# Patient Record
Sex: Male | Born: 1980 | Race: Black or African American | Hispanic: No | Marital: Single | Smoking: Never smoker
Health system: Southern US, Community
[De-identification: ages and names within clinical notes are randomized; demographics above are authoritative.]

---

## 2013-12-30 ENCOUNTER — Emergency Department (HOSPITAL_COMMUNITY)
Admission: EM | Admit: 2013-12-30 | Discharge: 2013-12-30 | Disposition: A | Discharge status: Home / Self Care | Payer: Self-pay | Attending: Emergency Medicine | Admitting: Emergency Medicine

## 2013-12-30 ENCOUNTER — Encounter (HOSPITAL_COMMUNITY): Payer: Self-pay | Admitting: Emergency Medicine

## 2013-12-30 DIAGNOSIS — R200 Anesthesia of skin: Secondary | ICD-10-CM | POA: Insufficient documentation

## 2013-12-30 DIAGNOSIS — J0100 Acute maxillary sinusitis, unspecified: Secondary | ICD-10-CM | POA: Insufficient documentation

## 2013-12-30 MED ORDER — PSEUDOEPHEDRINE HCL ER 120 MG PO TB12: 120.0000 mg | ORAL_TABLET | Freq: Two times a day (BID) | ORAL | Status: DC

## 2013-12-30 MED ORDER — AMOXICILLIN 500 MG PO CAPS: 1000.0000 mg | ORAL_CAPSULE | Freq: Two times a day (BID) | ORAL | Status: DC

## 2013-12-30 MED ORDER — NAPROXEN 500 MG PO TABS: 500.0000 mg | ORAL_TABLET | Freq: Two times a day (BID) | ORAL | Status: DC

## 2013-12-30 NOTE — Discharge Instructions (Signed)
Sinusitis °Sinusitis is redness, soreness, and puffiness (inflammation) of the air pockets in the bones of your face (sinuses). The redness, soreness, and puffiness can cause air and mucus to get trapped in your sinuses. This can allow germs to grow and cause an infection.  °HOME CARE  °· Drink enough fluids to keep your pee (urine) clear or pale yellow. °· Use a humidifier in your home. °· Run a hot shower to create steam in the bathroom. Sit in the bathroom with the door closed. Breathe in the steam 3-4 times a day. °· Put a warm, moist washcloth on your face 3-4 times a day, or as told by your doctor. °· Use salt water sprays (saline sprays) to wet the thick fluid in your nose. This can help the sinuses drain. °· Only take medicine as told by your doctor. °GET HELP RIGHT AWAY IF:  °· Your pain gets worse. °· You have very bad headaches. °· You are sick to your stomach (nauseous). °· You throw up (vomit). °· You are very sleepy (drowsy) all the time. °· Your face is puffy (swollen). °· Your vision changes. °· You have a stiff neck. °· You have trouble breathing. °MAKE SURE YOU:  °· Understand these instructions. °· Will watch your condition. °· Will get help right away if you are not doing well or get worse. °Document Released: 08/24/2007 Document Revised: 11/30/2011 Document Reviewed: 10/11/2011 °ExitCare® Patient Information ©2015 [employer]. This information is not intended to replace advice given to you by your health care provider. Make sure you discuss any questions you have with your health care provider. ° °

## 2013-12-30 NOTE — ED Provider Notes (Signed)
CSN: [id number]     Arrival date & time 12/30/13  [id number] History   First MD Initiated Contact with Patient 12/30/13 1005     Chief Complaint  Patient presents with  . Chills  . Numbness    HPI Patient presents to the emergency room with complaints of fevers chills nasal congestion and facial pain. The symptoms started a couple days ago. He thought he was developing a cold because of the nasal congestion. His symptoms have progressed and now he feels like he is having pressure, pain and numbness on the right side of his face. Symptoms worsened with palpation. He denies any trouble with neck pain or stiffness. He's not having any issues with numbness or weakness elsewhere. Is not having any slurred speech or trouble with his vision History reviewed. No pertinent past medical history. History reviewed. No pertinent past surgical history. No family history on file. History  Substance Use Topics  . Smoking status: Never Smoker   . Smokeless tobacco: Not on file  . Alcohol Use: No    Review of Systems  All other systems reviewed and are negative.     Allergies  Review of patient's allergies indicates no known allergies.  Home Medications   Prior to Admission medications   Not on File   BP 134/83  Pulse 97  Temp(Src) 98 F (36.7 C) (Oral)  Resp 18  SpO2 98% Physical Exam  Nursing note and vitals reviewed. Constitutional: He appears well-developed and well-nourished. No distress.  HENT:  Head: Normocephalic and atraumatic.  Right Ear: External ear normal.  Left Ear: External ear normal.  Mouth/Throat: No oropharyngeal exudate.  Sinus tenderness in the right maxillary region, no edema or erythema  Eyes: Conjunctivae are normal. Right eye exhibits no discharge. Left eye exhibits no discharge. No scleral icterus.  Neck: Neck supple. No tracheal deviation present.  Cardiovascular: Normal rate, regular rhythm and intact distal pulses.   Pulmonary/Chest: Effort normal and breath  sounds normal. No stridor. No respiratory distress. He has no wheezes. He has no rales.  Abdominal: Soft. Bowel sounds are normal. He exhibits no distension. There is no tenderness. There is no rebound and no guarding.  Musculoskeletal: He exhibits no edema and no tenderness.  Neurological: He is alert. He has normal strength. No cranial nerve deficit (no facial droop, extraocular movements intact, no slurred speech) or sensory deficit. He exhibits normal muscle tone. He displays no seizure activity. Coordination normal.  Skin: Skin is warm and dry. No rash noted.  Psychiatric: He has a normal mood and affect.    ED Course  Procedures (including critical care time)   MDM   Final diagnoses:  Acute maxillary sinusitis, recurrence not specified   Patient's symptoms are consistent with a sinusitis. Possibly viral or early bacterial. With his unilateral symptoms I will prescribe a course of amoxicillin, decongestants and medications for pain    Linwood DibblesJon Kelyse Pask, MD 12/30/13 1022

## 2013-12-30 NOTE — ED Notes (Signed)
Pt c/o right side numbness to face and chills x 2 days, states he thought he had a cold due to his nasal congestion. Denies pain.

## 2014-01-13 ENCOUNTER — Ambulatory Visit (INDEPENDENT_AMBULATORY_CARE_PROVIDER_SITE_OTHER): Payer: Self-pay | Admitting: Family Medicine

## 2014-01-13 VITALS — BP 118/80 | HR 71 | Temp 98.0°F | Resp 18 | Ht 68.0 in | Wt 185.6 lb

## 2014-01-13 DIAGNOSIS — Z0000 Encounter for general adult medical examination without abnormal findings: Secondary | ICD-10-CM

## 2014-01-13 DIAGNOSIS — Z029 Encounter for administrative examinations, unspecified: Secondary | ICD-10-CM
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
## 2014-04-18 ENCOUNTER — Emergency Department (HOSPITAL_COMMUNITY)
Admission: EM | Admit: 2014-04-18 | Discharge: 2014-04-18 | Disposition: A | Discharge status: Home / Self Care | Payer: Self-pay | Attending: Emergency Medicine | Admitting: Emergency Medicine

## 2014-04-18 ENCOUNTER — Emergency Department (HOSPITAL_COMMUNITY): Payer: Self-pay

## 2014-04-18 ENCOUNTER — Other Ambulatory Visit: Payer: Self-pay

## 2014-04-18 ENCOUNTER — Encounter (HOSPITAL_COMMUNITY): Payer: Self-pay

## 2014-04-18 DIAGNOSIS — S61432A Puncture wound without foreign body of left hand, initial encounter: Secondary | ICD-10-CM | POA: Insufficient documentation

## 2014-04-18 DIAGNOSIS — S0191XA Laceration without foreign body of unspecified part of head, initial encounter: Secondary | ICD-10-CM

## 2014-04-18 DIAGNOSIS — Y9289 Other specified places as the place of occurrence of the external cause: Secondary | ICD-10-CM | POA: Insufficient documentation

## 2014-04-18 DIAGNOSIS — S0183XA Puncture wound without foreign body of other part of head, initial encounter: Secondary | ICD-10-CM | POA: Insufficient documentation

## 2014-04-18 DIAGNOSIS — S61412A Laceration without foreign body of left hand, initial encounter: Secondary | ICD-10-CM

## 2014-04-18 DIAGNOSIS — S3992XA Unspecified injury of lower back, initial encounter: Secondary | ICD-10-CM | POA: Insufficient documentation

## 2014-04-18 DIAGNOSIS — Z23 Encounter for immunization: Secondary | ICD-10-CM | POA: Insufficient documentation

## 2014-04-18 DIAGNOSIS — S21212A Laceration without foreign body of left back wall of thorax without penetration into thoracic cavity, initial encounter: Secondary | ICD-10-CM

## 2014-04-18 DIAGNOSIS — Y9389 Activity, other specified: Secondary | ICD-10-CM | POA: Insufficient documentation

## 2014-04-18 DIAGNOSIS — S21232A Puncture wound without foreign body of left back wall of thorax without penetration into thoracic cavity, initial encounter: Secondary | ICD-10-CM | POA: Insufficient documentation

## 2014-04-18 DIAGNOSIS — Y998 Other external cause status: Secondary | ICD-10-CM | POA: Insufficient documentation

## 2014-04-18 MED ORDER — TETANUS-DIPHTH-ACELL PERTUSSIS 5-2.5-18.5 LF-MCG/0.5 IM SUSP
0.5000 mL | Freq: Once | INTRAMUSCULAR | Status: AC
  Administered 2014-04-18: 0.5 mL via INTRAMUSCULAR
  Filled 2014-04-18: qty 0.5

## 2014-04-18 MED ORDER — MORPHINE SULFATE 4 MG/ML IJ SOLN
8.0000 mg | Freq: Once | INTRAMUSCULAR | Status: AC
  Administered 2014-04-18: 8 mg via INTRAVENOUS
  Filled 2014-04-18: qty 2
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
## 2014-04-18 NOTE — Discharge Instructions (Signed)
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
## 2014-04-18 NOTE — ED Notes (Signed)
Ultrasound  of lungs done by Dr. Radford PaxBeaton.

## 2014-12-08 ENCOUNTER — Emergency Department (HOSPITAL_COMMUNITY)
Admission: EM | Admit: 2014-12-08 | Discharge: 2014-12-08 | Disposition: A | Discharge status: Home / Self Care | Payer: No Typology Code available for payment source | Attending: Emergency Medicine | Admitting: Emergency Medicine

## 2014-12-08 ENCOUNTER — Encounter (HOSPITAL_COMMUNITY): Payer: Self-pay | Admitting: Emergency Medicine

## 2014-12-08 ENCOUNTER — Emergency Department (HOSPITAL_COMMUNITY): Payer: No Typology Code available for payment source

## 2014-12-08 DIAGNOSIS — Y998 Other external cause status: Secondary | ICD-10-CM | POA: Diagnosis not present

## 2014-12-08 DIAGNOSIS — S161XXA Strain of muscle, fascia and tendon at neck level, initial encounter: Secondary | ICD-10-CM | POA: Insufficient documentation

## 2014-12-08 DIAGNOSIS — S39012A Strain of muscle, fascia and tendon of lower back, initial encounter: Secondary | ICD-10-CM | POA: Diagnosis not present

## 2014-12-08 DIAGNOSIS — Y92410 Unspecified street and highway as the place of occurrence of the external cause: Secondary | ICD-10-CM | POA: Insufficient documentation

## 2014-12-08 DIAGNOSIS — Y9389 Activity, other specified: Secondary | ICD-10-CM | POA: Insufficient documentation

## 2014-12-08 DIAGNOSIS — Z79899 Other long term (current) drug therapy: Secondary | ICD-10-CM | POA: Insufficient documentation

## 2014-12-08 DIAGNOSIS — S199XXA Unspecified injury of neck, initial encounter: Secondary | ICD-10-CM | POA: Diagnosis present

## 2014-12-08 DIAGNOSIS — S0990XA Unspecified injury of head, initial encounter: Secondary | ICD-10-CM | POA: Diagnosis not present

## 2014-12-08 MED ORDER — METHOCARBAMOL 500 MG PO TABS
750.0000 mg | ORAL_TABLET | Freq: Once | ORAL | Status: AC
  Administered 2014-12-08: 750 mg via ORAL
  Filled 2014-12-08: qty 2

## 2014-12-08 MED ORDER — IBUPROFEN 600 MG PO TABS: 600.0000 mg | ORAL_TABLET | Freq: Four times a day (QID) | ORAL | Status: DC | PRN

## 2014-12-08 MED ORDER — METHOCARBAMOL 750 MG PO TABS: 750.0000 mg | ORAL_TABLET | Freq: Three times a day (TID) | ORAL | Status: DC

## 2014-12-08 MED ORDER — KETOROLAC TROMETHAMINE 30 MG/ML IJ SOLN
30.0000 mg | Freq: Once | INTRAMUSCULAR | Status: AC
  Administered 2014-12-08: 30 mg via INTRAMUSCULAR
  Filled 2014-12-08: qty 1
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
## 2014-12-08 NOTE — Discharge Instructions (Signed)
As discussed no fractures, you have been given a muscle relaxer and antiinflammatory for your discomfort

## 2014-12-08 NOTE — Progress Notes (Signed)
CM spoke with pt who confirms uninsured [place] resident with no pcp.  CM discussed and provided written information for uninsured accepting pcps, discussed the importance of pcp vs EDP services for f/u care, www.needymeds.org, www.goodrx.com, discounted pharmacies and other [place] resources such as [facility] , [facility], affordable care act, financial assistance, uninsured dental services, [place] [facility], DSS and  health department  Reviewed resources for [place] uninsured accepting pcps like [facility], [facility] [address], [facility], [facility], local urgent care centers, [facility], [facility], general medical clinics, [facility], [facility] plus others, medication resources, [facility] out patient [facility]ies and housing Pt voiced understanding and appreciation of resources provided   Provided [facility] contact information Pt agreed to a referral Cm completed referral Pt to be contact by [facility] clinical liason  
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
## 2016-01-20 ENCOUNTER — Emergency Department (HOSPITAL_COMMUNITY): Payer: No Typology Code available for payment source

## 2016-01-20 ENCOUNTER — Emergency Department (HOSPITAL_COMMUNITY)
Admission: EM | Admit: 2016-01-20 | Discharge: 2016-01-20 | Disposition: A | Discharge status: Home / Self Care | Payer: No Typology Code available for payment source | Attending: Emergency Medicine | Admitting: Emergency Medicine

## 2016-01-20 ENCOUNTER — Encounter (HOSPITAL_COMMUNITY): Payer: Self-pay | Admitting: Emergency Medicine

## 2016-01-20 DIAGNOSIS — M542 Cervicalgia: Secondary | ICD-10-CM | POA: Insufficient documentation

## 2016-01-20 DIAGNOSIS — Y9389 Activity, other specified: Secondary | ICD-10-CM | POA: Diagnosis not present

## 2016-01-20 DIAGNOSIS — S4991XA Unspecified injury of right shoulder and upper arm, initial encounter: Secondary | ICD-10-CM | POA: Diagnosis present

## 2016-01-20 DIAGNOSIS — R51 Headache: Secondary | ICD-10-CM | POA: Insufficient documentation

## 2016-01-20 DIAGNOSIS — R0781 Pleurodynia: Secondary | ICD-10-CM | POA: Insufficient documentation

## 2016-01-20 DIAGNOSIS — Y92410 Unspecified street and highway as the place of occurrence of the external cause: Secondary | ICD-10-CM | POA: Diagnosis not present

## 2016-01-20 DIAGNOSIS — M545 Low back pain: Secondary | ICD-10-CM | POA: Diagnosis not present

## 2016-01-20 DIAGNOSIS — M25511 Pain in right shoulder: Secondary | ICD-10-CM | POA: Insufficient documentation

## 2016-01-20 DIAGNOSIS — Y999 Unspecified external cause status: Secondary | ICD-10-CM | POA: Diagnosis not present

## 2016-01-20 MED ORDER — IBUPROFEN 800 MG PO TABS: 800.0000 mg | ORAL_TABLET | Freq: Three times a day (TID) | ORAL | 0 refills | Status: AC

## 2016-01-20 MED ORDER — IBUPROFEN 100 MG/5ML PO SUSP: 800.0000 mg | Freq: Once | ORAL | Status: DC

## 2016-01-20 MED ORDER — METHOCARBAMOL 500 MG PO TABS: 500.0000 mg | ORAL_TABLET | Freq: Three times a day (TID) | ORAL | 0 refills | Status: AC | PRN

## 2016-01-20 MED ORDER — HYDROCODONE-ACETAMINOPHEN 5-325 MG PO TABS
1.0000 | ORAL_TABLET | ORAL | 0 refills | Status: AC | PRN
Start: 2016-01-20 — End: ?

## 2016-01-20 MED ORDER — METHOCARBAMOL 500 MG PO TABS
1000.0000 mg | ORAL_TABLET | Freq: Once | ORAL | Status: AC
  Administered 2016-01-20: 1000 mg via ORAL
  Filled 2016-01-20: qty 2

## 2016-01-20 MED ORDER — HYDROCODONE-ACETAMINOPHEN 5-325 MG PO TABS
1.0000 | ORAL_TABLET | Freq: Once | ORAL | Status: AC
  Administered 2016-01-20: 1 via ORAL
  Filled 2016-01-20: qty 1
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
## 2016-01-20 NOTE — ED Notes (Signed)
Pt complaining of numbness in legs and hands which just started once he arrived
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
## 2016-01-20 NOTE — ED Notes (Signed)
Pt left prior to ibuprofen admin .

## 2016-01-20 NOTE — ED Notes (Signed)
Assisted to remove patient from long board.
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
